# Patient Record
Sex: Female | Born: 1980 | Race: Black or African American | Hispanic: No | Marital: Single | Smoking: Never smoker
Health system: Southern US, Community
[De-identification: ages and names within clinical notes are randomized; demographics above are authoritative.]

## PROBLEM LIST (undated history)

## (undated) DIAGNOSIS — R030 Elevated blood-pressure reading, without diagnosis of hypertension: Secondary | ICD-10-CM

## (undated) HISTORY — DX: Elevated blood-pressure reading, without diagnosis of hypertension: R03.0

## (undated) HISTORY — PX: BREAST CYST ASPIRATION: SHX578

---

## 2015-01-25 ENCOUNTER — Other Ambulatory Visit: Payer: Self-pay | Admitting: Gynecology

## 2015-01-25 DIAGNOSIS — N6001 Solitary cyst of right breast: Secondary | ICD-10-CM

## 2015-02-05 ENCOUNTER — Ambulatory Visit
Admission: RE | Admit: 2015-02-05 | Discharge: 2015-02-05 | Disposition: A | Discharge status: Home / Self Care | Payer: BLUE CROSS/BLUE SHIELD | Source: Ambulatory Visit | Attending: Gynecology | Admitting: Gynecology

## 2015-02-05 ENCOUNTER — Other Ambulatory Visit: Payer: Self-pay | Admitting: Gynecology

## 2015-02-05 DIAGNOSIS — N6001 Solitary cyst of right breast: Secondary | ICD-10-CM

## 2015-02-05 DIAGNOSIS — N6022 Fibroadenosis of left breast: Secondary | ICD-10-CM

## 2015-03-01 ENCOUNTER — Ambulatory Visit
Admission: RE | Admit: 2015-03-01 | Discharge: 2015-03-01 | Disposition: A | Discharge status: Home / Self Care | Payer: BLUE CROSS/BLUE SHIELD | Source: Ambulatory Visit | Attending: Gynecology | Admitting: Gynecology

## 2015-03-01 DIAGNOSIS — N6001 Solitary cyst of right breast: Secondary | ICD-10-CM

## 2015-12-30 ENCOUNTER — Ambulatory Visit (INDEPENDENT_AMBULATORY_CARE_PROVIDER_SITE_OTHER): Payer: BLUE CROSS/BLUE SHIELD | Admitting: Family Medicine

## 2015-12-30 ENCOUNTER — Encounter: Payer: Self-pay | Admitting: Family Medicine

## 2015-12-30 VITALS — BP 146/88 | HR 98 | Temp 98.9°F | Ht 63.75 in | Wt 139.2 lb

## 2015-12-30 DIAGNOSIS — Z0001 Encounter for general adult medical examination with abnormal findings: Secondary | ICD-10-CM | POA: Diagnosis not present

## 2015-12-30 DIAGNOSIS — M940 Chondrocostal junction syndrome [Tietze]: Secondary | ICD-10-CM | POA: Diagnosis not present

## 2015-12-30 DIAGNOSIS — R030 Elevated blood-pressure reading, without diagnosis of hypertension: Secondary | ICD-10-CM

## 2015-12-30 DIAGNOSIS — Z114 Encounter for screening for human immunodeficiency virus [HIV]: Secondary | ICD-10-CM

## 2015-12-30 NOTE — Progress Notes (Signed)
Phone: [phone]  Subjective:  Patient presents today to establish care. Chief complaint-noted.   See problem oriented charting  The following were reviewed and entered/updated in epic: Past Medical History:  Diagnosis Date  . Elevated blood pressure reading    Patient Active Problem List   Diagnosis Date Noted  . Elevated blood pressure reading    Past Surgical History:  Procedure Laterality Date  . BREAST CYST ASPIRATION     left and right both drained- benign    Family History  Problem Relation Age of Onset  . Hypertension Mother     mom [id number] at time of start  . Other Father     unknown cause of deaht    Medications- reviewed and updated Current Outpatient Prescriptions  Medication Sig Dispense Refill  . ibuprofen (ADVIL,MOTRIN) 800 MG tablet Take 800 mg by mouth every 8 (eight) hours as needed.     No current facility-administered medications for this visit.     Allergies-reviewed and updated No Known Allergies  Social History   Social History  . Marital status: Unknown    Spouse name: N/A  . Number of children: N/A  . Years of education: N/A   Social History Main Topics  . Smoking status: Never Smoker  . Smokeless tobacco: Never Used  . Alcohol use Yes     Comment: Social  . Drug use: No  . Sexual activity: Yes    Birth control/ protection: None, Condom   Other Topics Concern  . None   Social History Narrative   Single lives with son 37 years old TogoJoshua in 12/2015.       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    Associates degree- [employer] [place]. States always wanted to live in [place]. Born in [place].       Hobbies: relaxing, writes poetry    ROS--Full ROS was completed Review of Systems  Constitutional: Negative for chills and fever.  HENT: Negative for hearing loss and tinnitus.   Eyes: Negative for blurred vision and double vision.  Respiratory: Positive for shortness of breath. Negative for cough, hemoptysis and wheezing.     Cardiovascular: Positive for chest pain. Negative for palpitations and leg swelling.  Gastrointestinal: Negative for heartburn and nausea.  Genitourinary: Negative for dysuria and urgency.  Musculoskeletal: Negative for myalgias and neck pain.  Skin: Negative for itching and rash.  Neurological: Negative for dizziness and headaches.  Endo/Heme/Allergies: Negative for polydipsia. Does not bruise/bleed easily.  Psychiatric/Behavioral: Negative for substance abuse and suicidal ideas.   Objective: BP (!) 146/88   Pulse 98   Temp 98.9 F (37.2 C) (Oral)   Ht 5' 3.75" (1.619 m)   Wt 139 lb 3.2 oz (63.1 kg)   LMP 12/20/2015   SpO2 98%   BMI 24.08 kg/m  Gen: NAD, resting comfortably HEENT: Mucous membranes are moist. Oropharynx normal. TM normal. Eyes: sclera and lids normal, PERRLA Neck: no thyromegaly, no cervical lymphadenopathy CV: RRR no murmurs rubs or gallops Chest wall: very tender with palpation of 2nd and 3rd rib where connects to sternum.  Lungs: CTAB no crackles, wheeze, rhonchi Abdomen: soft/nontender/nondistended/normal bowel sounds. No rebound or guarding.  Ext: no edema Skin: warm, dry Neuro: 5/5 strength in upper and lower extremities, normal gait, normal reflexes  EKG from urgent care: sinus bradycardia with rate 58, normal axis, normal intervals, no hypertrophy. T waves noted as changes in anterior leads but on inspection t wave in same direction as qrs. Largely normal EKG  Assessment/Plan:  35 y.o. female presenting for annual physical.  Health Maintenance counseling: 1. Anticipatory guidance: Patient counseled regarding regular dental exams (advised to update), eye exams (glasses at night), wearing seatbelts.  2. Risk factor reduction:  Advised patient of need for regular exercise and diet rich and fruits and vegetables to reduce risk of heart attack and stroke. Exercises 3x a week. Diet reasonable 3. Immunizations/screenings/ancillary studies-  Health  Maintenance Due  Topic Date Due  . HIV Screening - next labs 11/04/1993  . TETANUS/TDAP - wants to wait due to chest discomfort 11/04/1997  . INFLUENZA VACCINE - wants to wait due to chest discomfort 09/10/2015   4. Cervical cancer screening- pap smear last year 5. Breast cancer screening-  breast exam with gynecology and mammograms starting age 38- prior workup for benign cysts 6. Colon cancer screening - no family history, start at age 35  Chest pain- costochondritis Elevated blood pressure S: noticed chest pain last week. Last week when she laid down it felt like it was hard to breath- heaviness left side upperf chest. Did note mild shortness of breath coming up fromher mail box but otherwise no exertional symptoms if not using upper body (does have pain with twisting, lifting, pulling- which she has to do at work). SOB may be related to not being able to take full deep breath as she has pain in left upper chest. Has not worked out during this time. Mild soreness when she takes a deep breath in. Sore to touch. Pain can be up to 7/10 with deep breath. Mainly when sitting no pain. Sitting is best position- laying down hurts. Reassuring EKG at urgent care as well as x-ray. Took 800mg  ibuprofen - Friday, twice on Saturday, twice on Sunday for pleurisy. Some nasuea on the medicine. Does not really feel like she helps.   Has thought intermittently may have pulled a muscle at work in the past. No recent prolonged travel- no calf pain- no calf or leg swelling  A/P: I agree pleurisy on differential but I suspect more likely costochondritis given pinpoint reproducible pain. Seems that deep breaths cause her pain in this area so tris to breath more shallow and may explain perception of SOB at times. Either way- ibuprofen twice a day reasonable for this. We discussed likely time course- up to a month. Discussed unlikely ACS (age, no family history, left upper chest, no other symptoms except possible SOB at  times. Doubt DVT with no leg swelling and doubt PE with poinpoint pain.   Biggest concern is BP- she will return with home log and home BPs within a mont  2-4 week follow up. Fasting labs before then. Bp follow up mainly.   Orders Placed This Encounter  Procedures  . CBC    Standing Status:   Future    Standing Expiration Date:   12/29/2016  . Comprehensive metabolic panel    [place]    Standing Status:   Future    Standing Expiration Date:   12/29/2016  . HIV antibody    Standing Status:   Future    Standing Expiration Date:   12/29/2016  . Lipid panel    Standing Status:   Future    Standing Expiration Date:   12/29/2016  . TSH    Standing Status:   Future    Standing Expiration Date:   12/29/2016    Meds ordered this encounter  Medications  . ibuprofen (ADVIL,MOTRIN) 800 MG tablet    Sig: Take 800  mg by mouth every 8 (eight) hours as needed.    Return precautions advised.  Tana ConchStephen Genee Rann, MD

## 2015-12-30 NOTE — Progress Notes (Signed)
Pre visit review using our clinic review tool, if applicable. No additional management support is needed unless otherwise documented below in the visit note. 

## 2015-12-30 NOTE — Patient Instructions (Addendum)
Sign release of information at the check out desk for pap smear  Need tetanus shot and flu shot  Your blood pressure trend concerns me. I would like for you to buy/use a home cuff to check at least 5x a week. Your goal is <140/90 but preferably <130/80.  see me in 2-4 weeks. Bring your home cuff and your log of blood pressures with you to visit.   Schedule a lab visit at the check out desk within 2 weeks. Return for future fasting labs meaning nothing but water after midnight please. Ok to take your medications with water.   Costochondritis Costochondritis is swelling and irritation (inflammation) of the tissue (cartilage) that connects your ribs to your breastbone (sternum). This causes pain in the front of your chest. Usually, the pain:  Starts gradually.  Is in more than one rib. This condition usually goes away on its own over time usually within about a month.  Follow these instructions at home:  Do not do anything that makes your pain worse.  If directed, put ice on the painful area:  Put ice in a plastic bag.  Place a towel between your skin and the bag.  Leave the ice on for 20 minutes, 2-3 times a day.  If directed, put heat on the affected area as often as told by your doctor. Use the heat source that your doctor tells you to use, such as a moist heat pack or a heating pad.  Place a towel between your skin and the heat source.  Leave the heat on for 20-30 minutes.  Take off the heat if your skin turns bright red. This is very important if you cannot feel pain, heat, or cold. You may have a greater risk of getting burned.  Take over-the-counter and prescription medicines only as told by your doctor.  Return to your normal activities as told by your doctor. Ask your doctor what activities are safe for you.  Keep all follow-up visits as told by your doctor. This is important. Contact a doctor if:  You have chills or a fever.  Your pain does not go away or it gets  worse.  You have a cough that does not go away. Get help right away if:  You are short of breath. This information is not intended to replace advice given to you by your health care provider. Make sure you discuss any questions you have with your health care provider. Document Released: 07/15/2007 Document Revised: 08/16/2015 Document Reviewed: 05/22/2015 Elsevier Interactive Patient Education  2017 [employer]

## 2016-01-07 ENCOUNTER — Telehealth: Payer: Self-pay

## 2016-01-07 NOTE — Telephone Encounter (Signed)
Called and left a voicemail message for patient asking for a return phone call. We received FMLA paperwork for patient. Need to discuss getting it filled out and returned.

## 2016-01-08 ENCOUNTER — Other Ambulatory Visit (INDEPENDENT_AMBULATORY_CARE_PROVIDER_SITE_OTHER): Payer: BLUE CROSS/BLUE SHIELD

## 2016-01-08 DIAGNOSIS — Z0001 Encounter for general adult medical examination with abnormal findings: Secondary | ICD-10-CM | POA: Diagnosis not present

## 2016-01-08 DIAGNOSIS — Z114 Encounter for screening for human immunodeficiency virus [HIV]: Secondary | ICD-10-CM

## 2016-01-08 LAB — LIPID PANEL
CHOL/HDL RATIO: 2
Cholesterol: 148 mg/dL (ref 0–200)
HDL: 65.1 mg/dL (ref >39.00)
LDL CALC: 75 mg/dL (ref 0–99)
NonHDL: 82.93
TRIGLYCERIDES: 41 mg/dL (ref 0.0–149.0)
VLDL: 8.2 mg/dL (ref 0.0–40.0)

## 2016-01-08 LAB — COMPREHENSIVE METABOLIC PANEL
ALT: 13 U/L (ref 0–35)
AST: 16 U/L (ref 0–37)
Albumin: 4.4 g/dL (ref 3.5–5.2)
Alkaline Phosphatase: 44 U/L (ref 39–117)
BUN: 9 mg/dL (ref 6–23)
CHLORIDE: 101 meq/L (ref 96–112)
CO2: 27 meq/L (ref 19–32)
CREATININE: 0.79 mg/dL (ref 0.40–1.20)
Calcium: 9.4 mg/dL (ref 8.4–10.5)
GFR: 105.22 mL/min (ref >60.00)
Glucose, Bld: 83 mg/dL (ref 70–99)
POTASSIUM: 3.6 meq/L (ref 3.5–5.1)
SODIUM: 137 meq/L (ref 135–145)
Total Bilirubin: 0.7 mg/dL (ref 0.2–1.2)
Total Protein: 7.3 g/dL (ref 6.0–8.3)

## 2016-01-08 LAB — CBC
HCT: 40.8 % (ref 36.0–46.0)
Hemoglobin: 13.9 g/dL (ref 12.0–15.0)
MCHC: 34 g/dL (ref 30.0–36.0)
MCV: 90.6 fl (ref 78.0–100.0)
Platelets: 241 10*3/uL (ref 150.0–400.0)
RBC: 4.51 Mil/uL (ref 3.87–5.11)
RDW: 13 % (ref 11.5–15.5)
WBC: 6.9 10*3/uL (ref 4.0–10.5)

## 2016-01-08 LAB — TSH: TSH: 0.62 u[IU]/mL (ref 0.35–4.50)

## 2016-01-09 LAB — HIV ANTIBODY (ROUTINE TESTING W REFLEX): HIV 1&2 Ab, 4th Generation: NONREACTIVE

## 2016-01-13 ENCOUNTER — Ambulatory Visit (INDEPENDENT_AMBULATORY_CARE_PROVIDER_SITE_OTHER): Payer: BLUE CROSS/BLUE SHIELD | Admitting: Family Medicine

## 2016-01-13 ENCOUNTER — Encounter: Payer: Self-pay | Admitting: Family Medicine

## 2016-01-13 VITALS — BP 144/94 | HR 87 | Temp 98.4°F | Ht 63.75 in | Wt 138.0 lb

## 2016-01-13 DIAGNOSIS — M940 Chondrocostal junction syndrome [Tietze]: Secondary | ICD-10-CM

## 2016-01-13 DIAGNOSIS — R030 Elevated blood-pressure reading, without diagnosis of hypertension: Secondary | ICD-10-CM

## 2016-01-13 DIAGNOSIS — R0789 Other chest pain: Secondary | ICD-10-CM | POA: Insufficient documentation

## 2016-01-13 MED ORDER — CYCLOBENZAPRINE HCL 5 MG PO TABS: 5.0000 mg | ORAL_TABLET | Freq: Three times a day (TID) | ORAL | 1 refills | Status: DC | PRN

## 2016-01-13 NOTE — Assessment & Plan Note (Signed)
S: monitored at home and averaged 140/90.  BP Readings from Last 3 Encounters:  01/13/16 (!) 144/94  12/30/15 (!) 146/88  A/P: We discussed this couldbe hypertension (mother does have but just recently diagnosed) but also could be due to the pain from her chest- if BP up when pain improves would consider medication- she already exercises (outside of current chest pain) and eats reasonably well- weight reasonable so do not think weight loss/lifestyle will help significantly.

## 2016-01-13 NOTE — Patient Instructions (Addendum)
Schedule at check out for 9:15 on the 20th of December (ok to unblock spot)   Avoid any strenuous lifting- I would avoid anything over 5-10 lbs  Monitor blood pressure for now considering your pain level

## 2016-01-13 NOTE — Assessment & Plan Note (Signed)
S: Patient with continued left upper chest pain. Worse with palpation. No exertional symptoms. Pain 7/10 right now and most of the day- schedules ibuprofen- worse if she does not. Pain gets worse with pushing or pulling. 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 Took groceries in her house one day and next day had soreness on right side as well. No shortness of breath reported since last visit- and prior seemed to be related to pain with taking deep breath in her left chest. She does have worse discomfort when laying all the way down so props herself up at night. A/P: Will keep out of work another 2 weeks the reassess. Would be a month at that point and if no improvement in pain. Consider chest MRI if covered, CT if not. Flexeril has helped a patient with similar symptoms recently so will trial. Asks about biofreeze which is ok as well. If MRI normal, pain persists- consider cardiac workup. Once again given pinpoint pain strongly doubt cardiac or DVT/PE related- still no leg swelling or pain.

## 2016-01-13 NOTE — Progress Notes (Signed)
Pre visit review using our clinic review tool, if applicable. No additional management support is needed unless otherwise documented below in the visit note. 

## 2016-01-13 NOTE — Progress Notes (Signed)
Subjective:  Stacy Costa is a 35 y.o. year old very pleasant female patient who presents for/with See problem oriented charting ROS- No shortness of breath. No headache or blurry vision. No nausea, left arm or neck pain.   Past Medical History-  Patient Active Problem List   Diagnosis Date Noted  . Costochondritis 01/13/2016  . Elevated blood pressure reading     Medications- reviewed and updated Current Outpatient Prescriptions  Medication Sig Dispense Refill  . ibuprofen (ADVIL,MOTRIN) 800 MG tablet Take 800 mg by mouth every 8 (eight) hours as needed.    . cyclobenzaprine (FLEXERIL) 5 MG tablet Take 1 tablet (5 mg total) by mouth 3 (three) times daily as needed for muscle spasms. 30 tablet 1   No current facility-administered medications for this visit.     Objective: BP (!) 144/94   Pulse 87   Temp 98.4 F (36.9 C) (Oral)   Ht 5' 3.75" (1.619 m)   Wt 138 lb (62.6 kg)   LMP 12/20/2015   SpO2 97%   BMI 23.87 kg/m  Gen: NAD, resting comfortably CV: RRR no murmurs rubs or gallops Extremely tender to palpation on left chest wall worst at junction of 2nd and 3rd rib with sternum- some tenderness lower Lungs: CTAB no crackles, wheeze, rhonchi Abdomen: soft/nontender/nondistended/normal bowel sounds. No rebound or guarding.  Ext: no edema Skin: warm, dry, no rash  Assessment/Plan:  Costochondritis S: Patient with continued left upper chest pain. Worse with palpation. No exertional symptoms. Pain 7/10 right now and most of the day- schedules ibuprofen- worse if she does not. Pain gets worse with pushing or pulling. 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 Took groceries in her house one day and next day had soreness on right side as well. No shortness of breath reported since last visit- and prior seemed to be related to pain with taking deep breath in her left chest. She does have worse discomfort when laying all the way down so props  herself up at night. A/P: Will keep out of work another 2 weeks the reassess. Would be a month at that point and if no improvement in pain. Consider chest MRI if covered, CT if not. Flexeril has helped a patient with similar symptoms recently so will trial. Asks about biofreeze which is ok as well. If MRI normal, pain persists- consider cardiac workup. Once again given pinpoint pain strongly doubt cardiac or DVT/PE related- still no leg swelling or pain.   Elevated blood pressure reading S: monitored at home and averaged 140/90.  BP Readings from Last 3 Encounters:  01/13/16 (!) 144/94  12/30/15 (!) 146/88  A/P: We discussed this couldbe hypertension (mother does have but just recently diagnosed) but also could be due to the pain from her chest- if BP up when pain improves would consider medication- she already exercises (outside of current chest pain) and eats reasonably well- weight reasonable so do not think weight loss/lifestyle will help significantly.   2 weeks- FMLA out through 20th of december  Meds ordered this encounter  Medications  . cyclobenzaprine (FLEXERIL) 5 MG tablet    Sig: Take 1 tablet (5 mg total) by mouth 3 (three) times daily as needed for muscle spasms.    Dispense:  30 tablet    Refill:  1    Return precautions advised.  Tana ConchStephen Keyasia Jolliff, MD

## 2016-01-29 ENCOUNTER — Other Ambulatory Visit: Payer: Self-pay

## 2016-01-29 ENCOUNTER — Ambulatory Visit (INDEPENDENT_AMBULATORY_CARE_PROVIDER_SITE_OTHER): Payer: BLUE CROSS/BLUE SHIELD | Admitting: Family Medicine

## 2016-01-29 ENCOUNTER — Encounter: Payer: Self-pay | Admitting: Family Medicine

## 2016-01-29 DIAGNOSIS — M940 Chondrocostal junction syndrome [Tietze]: Secondary | ICD-10-CM | POA: Diagnosis not present

## 2016-01-29 DIAGNOSIS — R079 Chest pain, unspecified: Secondary | ICD-10-CM

## 2016-01-29 DIAGNOSIS — R030 Elevated blood-pressure reading, without diagnosis of hypertension: Secondary | ICD-10-CM | POA: Diagnosis not present

## 2016-01-29 MED ORDER — IBUPROFEN 800 MG PO TABS: 800.0000 mg | ORAL_TABLET | Freq: Three times a day (TID) | ORAL | 0 refills | Status: DC | PRN

## 2016-01-29 MED ORDER — CYCLOBENZAPRINE HCL 5 MG PO TABS: 5.0000 mg | ORAL_TABLET | Freq: Three times a day (TID) | ORAL | 1 refills | Status: DC | PRN

## 2016-01-29 MED ORDER — AMLODIPINE BESYLATE 2.5 MG PO TABS: 2.5000 mg | ORAL_TABLET | Freq: Every day | ORAL | 3 refills | Status: DC

## 2016-01-29 NOTE — Progress Notes (Signed)
Pre visit review using our clinic review tool, if applicable. No additional management support is needed unless otherwise documented below in the visit note. 

## 2016-01-29 NOTE — Patient Instructions (Signed)
Start low dose amlodipine 2.5mg  each morning  Would like to try the ibuprofen even just 400mg  OTC 2-3x a day to see if we can reduce overall burden on your kidneys and effect on blood pressure.   Refilled 800 ibuprofen and flexeril  We will call you within a week about your referral to sports medicine. If you do not hear within 2 weeks, give us a call.   Also entered MRI today- call us if have not heard by early January  Fill out paperwork for FMLA based on when we get sports medicine and MRI done  Follow up 1 month

## 2016-01-29 NOTE — Assessment & Plan Note (Signed)
S: controlled poorly on no medicatoin. Mainly 140s at home, rarely high 130s.  BP Readings from Last 3 Encounters:  01/29/16 (!) 164/84  01/13/16 (!) 144/94  12/30/15 (!) 146/88  A/P: opted for low dose amlodipine 2.5mg , wonder if will be able to come off after pain in chest better

## 2016-01-29 NOTE — Assessment & Plan Note (Signed)
S: Pain at baseline down from 7/10 to 6/10. With flexeril down to 4/10. Ibuprofen twice a day also helping. Biofreeze also helps. Severe enough to keep her out of work- Worsens significantly after day of activity but not at time time of activity (no exertional symptoms and no shortness of breath). Biofreeze helps. She also has had over a month of symptoms now and this would normally be when we would expect improvement A/P: We had agreed to pursue chest MR if no significant improvement and will pursue at this time. 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

## 2016-01-29 NOTE — Progress Notes (Signed)
Subjective:  Stacy Costa is a 35 y.o. year old very pleasant female patient who presents for/with See problem oriented charting ROS- no shortness of breath, exertional chest pain, fever, chills, cough   Past Medical History-  Patient Active Problem List   Diagnosis Date Noted  . Costochondritis 01/13/2016  . Elevated blood pressure reading     Medications- reviewed and updated Current Outpatient Prescriptions  Medication Sig Dispense Refill  . cyclobenzaprine (FLEXERIL) 5 MG tablet Take 1 tablet (5 mg total) by mouth 3 (three) times daily as needed for muscle spasms. 30 tablet 1  . ibuprofen (ADVIL,MOTRIN) 800 MG tablet Take 1 tablet (800 mg total) by mouth every 8 (eight) hours as needed. 30 tablet 0   No current facility-administered medications for this visit.     Objective: BP (!) 164/84 (BP Location: Left Arm, Patient Position: Sitting, Cuff Size: Large)   Pulse 88   Temp 98.3 F (36.8 C) (Oral)   Ht 5' 3.75" (1.619 m)   Wt 139 lb 12.8 oz (63.4 kg)   LMP 01/20/2016   SpO2 98%   BMI 24.19 kg/m  Gen: NAD, resting comfortably CV: RRR no murmurs rubs or gallops Very tender with palpation at junction of sternum and ribs 2-4 Lungs: CTAB no crackles, wheeze, rhonchi Abdomen: soft/nontender/nondistended/normal bowel sounds. No rebound or guarding.  Ext: no edema Skin: warm, dry, no rash over chest  Assessment/Plan:  Elevated blood pressure reading S: controlled poorly on no medicatoin. Mainly 140s at home, rarely high 130s.  BP Readings from Last 3 Encounters:  01/29/16 (!) 164/84  01/13/16 (!) 144/94  12/30/15 (!) 146/88  A/P: opted for low dose amlodipine 2.5mg , wonder if will be able to come off after pain in chest better  Costochondritis S: Pain at baseline down from 7/10 to 6/10. With flexeril down to 4/10. Ibuprofen twice a day also helping. Biofreeze also helps. Severe enough to keep her out of work- Worsens significantly after day of activity but not at  time time of activity (no exertional symptoms and no shortness of breath). Biofreeze helps. She also has had over a month of symptoms now and this would normally be when we would expect improvement A/P: We had agreed to pursue chest MR if no significant improvement and will pursue at this time. 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    1 month  Order stated MR sternum Orders Placed This Encounter  Procedures  . MR CHEST MEDIASTINUM LIMITED    Standing Status:   Future    Standing Expiration Date:   03/31/2017    Order Specific Question:   Reason for Exam (SYMPTOM  OR DIAGNOSIS REQUIRED)    Answer:   chest wall pain over a month 6/10. keeping her out of work. suspect costochondiritis but scanning given lack of improvement    Order Specific Question:   Preferred imaging location?    Answer:   [facility] (table limit-550lbs)    Order Specific Question:   What is the patient's sedation requirement?    Answer:   No Sedation    Order Specific Question:   Does the patient have a pacemaker or implanted devices?    Answer:   No  . Ambulatory referral to Sports Medicine    Referral Priority:   Routine    Referral Type:  Consultation    Number of Visits Requested:   1    Meds ordered this encounter  Medications  . amLODipine (NORVASC) 2.5 MG tablet    Sig: Take 1 tablet (2.5 mg total) by mouth daily.    Dispense:  90 tablet    Refill:  3  . cyclobenzaprine (FLEXERIL) 5 MG tablet    Sig: Take 1 tablet (5 mg total) by mouth 3 (three) times daily as needed for muscle spasms.    Dispense:  30 tablet    Refill:  1  . ibuprofen (ADVIL,MOTRIN) 800 MG tablet    Sig: Take 1 tablet (800 mg total) by mouth every 8 (eight) hours as needed.    Dispense:  30 tablet    Refill:  0    Return precautions advised.  Tana ConchStephen Tilla Wilborn, MD

## 2016-02-28 ENCOUNTER — Encounter: Payer: Self-pay | Admitting: Family Medicine

## 2016-02-28 ENCOUNTER — Ambulatory Visit (INDEPENDENT_AMBULATORY_CARE_PROVIDER_SITE_OTHER): Payer: BLUE CROSS/BLUE SHIELD | Admitting: Family Medicine

## 2016-02-28 VITALS — BP 138/88 | Ht 63.0 in | Wt 140.0 lb

## 2016-02-28 DIAGNOSIS — R0789 Other chest pain: Secondary | ICD-10-CM | POA: Diagnosis not present

## 2016-02-28 DIAGNOSIS — R0609 Other forms of dyspnea: Secondary | ICD-10-CM

## 2016-02-28 NOTE — Progress Notes (Signed)
  Stacy DroneShanetta Inniss - 36 y.o. female MRN [id number]  Date of birth: [birth year]    SUBJECTIVE:      Chief Complaint:/ HPI:   LEFT sided chest pain since week before H Thanksgiving. Remembers no specific instance of injury. Works as Pharmacist, [employer]. Was going to gym but has stopped secondary to pain. Had not changed her workout routine in months before her pain started. For first few weeks o chest pain she as\lso noted more SOB with climbing stairs but that has mostly resolved. No cough. For a brief time in December she had similar but much less severe pain on right side of chest. That resolved after about a week to 10 days.  Left chest pain is always present, somewhat worse with certain motions but present at rest. Possibly was worse initially with deep breath but not currently. No cough.   Has been out of work last few days/weeks because of the pain. Pain is aching and or pressure like. It may be a little worse with laying down.  Recently had several benign breast cysts aspirated---first tome she was diagnosed wt these. Aspiration did not change her chest pain.   ROS:     No fever, no unusual weight change. No rashes. No other unusual arthralgias or myalgias.  PERTINENT  PMH / PSH FH / / SH:  Past Medical, Surgical, Social, and Family History Reviewed & Updated in the EMR.  Pertinent findings include:  Breast cyst aspiration  Hx elevated BP  [facility] and by her report both were normal  OBJECTIVE: BP 138/88   Ht 5\' 3"  (1.6 m)   Wt 140 lb (63.5 kg)   LMP 01/20/2016   BMI 24.80 kg/m   Physical Exam:  Vital signs are reviewed. WD WN NAD CV RRR  LUNGS normal respiratory rate, no increased WOB CHEST WALL: ttp mid pectoral muscle and this reproduces her pain. Pec muscle is without notable defect.  US [place] and AC joint appear normal. Pec muscles are without any sign of defect.   ASSESSMENT & PLAN:  See problem based charting & AVS for pt instructions.

## 2016-02-28 NOTE — Assessment & Plan Note (Signed)
Most likely this is a pectoralis minor muscle strain although absolutely no hx of trauma or injury. Symptoms now haaave been present for almost 2 months which is not consistent wit  h that diagnosis. Her symptoms of SOB at initiation of chest pain concerns me for pulmonary embolus. Initially I wanted to see a CTA of chest---that would rule out PE, five me a look at her muscle tissue (some--not as good as MRI but some) and evaluate for anything terribly unusual like vascular aneurysm etc. Her insurance will not approve that so we will start with D Dimer. If positive, will proceed with CTA. If negative, will proceed with further eval./treatment for unusual chest pain possible pec minor tear.

## 2016-03-02 ENCOUNTER — Other Ambulatory Visit: Payer: BLUE CROSS/BLUE SHIELD

## 2016-03-02 LAB — D-DIMER, QUANTITATIVE: D-Dimer, Quant: <0.27 ug/mL-FEU (ref 0.00–0.50)

## 2016-03-03 ENCOUNTER — Telehealth: Payer: Self-pay | Admitting: *Deleted

## 2016-03-03 NOTE — Telephone Encounter (Signed)
-----   Message from Lizbeth BarkMelanie L Ceresi sent at 03/03/2016 11:32 AM EST ----- Regarding: lab results Contact: [phone] Pt had lab yesterday wanting to know if someone will call her with results

## 2016-03-03 NOTE — Telephone Encounter (Signed)
Can you call her with the results and see if we need to do the CT angiogram or not?  Or let me know what I can tell her. Thanks!

## 2016-03-04 NOTE — Telephone Encounter (Signed)
[name] Emailed you some [employer] exercises. If you can get them off her email let me know. He can mail them to her or she can pick them up. Regarding her work restrictions, I think as long as she didn't pick up anything greater than 30 pounds she would be fine. If she's not carpal with that and needs more restrictions that I am fine with that but I don't think she's going to injure herself. If you could send her letter. Picture she does follow-up with me because this is something weird. THANKS! Stacy LevySara Jenina Moening

## 2016-03-04 NOTE — Telephone Encounter (Signed)
Stacy Costa Please call her and tell her the test was NORMAL so likelihood of her havinga clot in her lung is very very very low.Since insurance will not approve CT scan,I think we move forward with rehab for her assuming this is a pec muscle tear. Let;'s have her come by and pick up a theraband and we can instruct her on wallpush ups, pec muscle rehab. I will try to come up w a handout---I am ion clinic this AM bit can maybe do one up for her at lunch--OR you can just show her pec exefcises. I want her doing them BID. Then I would see her back in3 -4 weeks. If no better then, we will retry the imaging. If she has questions, I will be haooy too call her this afternoon Let me know THANKS! Stacy LevySara Marshal Costa

## 2016-03-04 NOTE — Telephone Encounter (Signed)
I spoke with the patient and she has therabands at home so I said we can send her exercises thru mychart.  I'll look for some pec exercises.   She also works as a Pharmacist, [employer] and is currently on restrictions from her PCP, so she wants to see what work restrictions you have for her now so she can let her employer know. Thanks!

## 2016-03-10 DIAGNOSIS — L218 Other seborrheic dermatitis: Secondary | ICD-10-CM | POA: Diagnosis not present

## 2016-03-10 DIAGNOSIS — L700 Acne vulgaris: Secondary | ICD-10-CM | POA: Diagnosis not present

## 2016-03-24 ENCOUNTER — Telehealth: Payer: Self-pay | Admitting: *Deleted

## 2016-03-24 NOTE — Telephone Encounter (Signed)
Patient called still having some pain in her left chest/rib area.  Isn't able to do pushups and only limited exercises with a theraband.  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

## 2016-03-26 NOTE — Telephone Encounter (Signed)
I will certainly try to order it--her insurance blocked it kast time despite my best efforts S

## 2016-03-27 ENCOUNTER — Other Ambulatory Visit: Payer: Self-pay | Admitting: *Deleted

## 2016-03-27 ENCOUNTER — Ambulatory Visit (INDEPENDENT_AMBULATORY_CARE_PROVIDER_SITE_OTHER): Payer: BLUE CROSS/BLUE SHIELD | Admitting: Family Medicine

## 2016-03-27 ENCOUNTER — Encounter: Payer: Self-pay | Admitting: Family Medicine

## 2016-03-27 VITALS — BP 134/89 | HR 71 | Ht 63.0 in | Wt 140.0 lb

## 2016-03-27 DIAGNOSIS — K8020 Calculus of gallbladder without cholecystitis without obstruction: Secondary | ICD-10-CM

## 2016-03-27 DIAGNOSIS — R071 Chest pain on breathing: Secondary | ICD-10-CM

## 2016-03-27 DIAGNOSIS — R0789 Other chest pain: Secondary | ICD-10-CM

## 2016-03-28 NOTE — Progress Notes (Signed)
  Stacy Costa - 36 y.o. female MRN [id number]  Date of birth: 1980/10/19    SUBJECTIVE:      Chief Complaint:/ HPI:   continued left sided chest pain but more localized now Can reproduce by certain movements or positions. Radiates along left lower rib. Starts over xyphoid area. Sometimes radiates to back and occasionally has separate upper back pain that coincides but does not seem to be radiating from xyphoid area.No SOb. Occasinally pain has been severe---10/10--these episodes frighten her and worry her for something cardiac or lung related. No nausea associated ever and no sweats. Was not able to do pushup exercises or overhead press as seemed to exacerbate.   ROS:     See HPI    OBJECTIVE: BP 134/89   Pulse 71   Ht 5\' 3"  (1.6 m)   Wt 140 lb (63.5 kg)   BMI 24.80 kg/m   Physical Exam:  Vital signs are reviewed. CHEST TTP xyphoid and left lower rib margin. Xyphoid is somewhat nodular o palpation (bifid?) No TTP in mid pectoralis area BACK:  without deformity. Nontender. No CVA tenderness to percussion. EXTREMITY: FROM bilateral shoulders and strength 5/5 all planes of rotator cuff. No motion reproduces pain. TRUNK: hyperextension and rotation of trunk relative to hips reproduces pain SKIN no lesions or rash in area of chest / ribs PSYCH AXOX4. Normal affect.  ASSESSMENT & PLAN:  Odd---had CXR outside provider which was reportedly negative. We were unable to get CT chest after last visit secondary to insurance refusal. Did get d dimer which was negative. Costochondritis is most likely diagnosis but xyphoid is somewhat nodular on palpation and her clinical course is not unusually prolonged. Will try again to get Ct chest to rule out bony pathology / tumor, lung pathology and also evaluate for cause of a chronic costochondral inflammation source.. Pain seems to be more localized to bony skeleton at this visit rather than mid pectoral as on last visit.

## 2016-03-30 ENCOUNTER — Ambulatory Visit
Admission: RE | Admit: 2016-03-30 | Discharge: 2016-03-30 | Disposition: A | Discharge status: Home / Self Care | Payer: BLUE CROSS/BLUE SHIELD | Source: Ambulatory Visit | Attending: Family Medicine | Admitting: Family Medicine

## 2016-03-30 DIAGNOSIS — R072 Precordial pain: Secondary | ICD-10-CM | POA: Diagnosis not present

## 2016-03-30 DIAGNOSIS — R0789 Other chest pain: Secondary | ICD-10-CM

## 2016-03-30 MED ORDER — IOPAMIDOL (ISOVUE-300) INJECTION 61%
75.0000 mL | Freq: Once | INTRAVENOUS | Status: AC | PRN
  Administered 2016-03-30: 75 mL via INTRAVENOUS

## 2016-04-01 ENCOUNTER — Telehealth: Payer: Self-pay | Admitting: *Deleted

## 2016-04-01 NOTE — Telephone Encounter (Signed)
Can you call her about the CT results? Thanks!

## 2016-04-01 NOTE — Telephone Encounter (Signed)
Spoke with her about CT results. The rib sternum and xiphoid all look normal. Interestingly, they commented on a large number of gallstones potentially in her gallbladder. I wonder if this is what's causing her issues. He would be unusual but not unheard of for the gallbladder to radiate pain.  Stacy Costa Please set her up for gallbladder ultrasound THANKS! Stacy LevySara Shanique Costa

## 2016-04-02 DIAGNOSIS — K8020 Calculus of gallbladder without cholecystitis without obstruction: Secondary | ICD-10-CM | POA: Insufficient documentation

## 2016-04-07 ENCOUNTER — Ambulatory Visit
Admission: RE | Admit: 2016-04-07 | Discharge: 2016-04-07 | Disposition: A | Discharge status: Home / Self Care | Payer: BLUE CROSS/BLUE SHIELD | Source: Ambulatory Visit | Attending: Family Medicine | Admitting: Family Medicine

## 2016-04-07 ENCOUNTER — Telehealth: Payer: Self-pay | Admitting: Family Medicine

## 2016-04-07 ENCOUNTER — Ambulatory Visit (INDEPENDENT_AMBULATORY_CARE_PROVIDER_SITE_OTHER): Payer: BLUE CROSS/BLUE SHIELD | Admitting: Family Medicine

## 2016-04-07 ENCOUNTER — Encounter: Payer: Self-pay | Admitting: Family Medicine

## 2016-04-07 VITALS — BP 124/94 | HR 92 | Temp 98.6°F | Ht 63.0 in | Wt 140.6 lb

## 2016-04-07 DIAGNOSIS — R0789 Other chest pain: Secondary | ICD-10-CM | POA: Diagnosis not present

## 2016-04-07 DIAGNOSIS — K8020 Calculus of gallbladder without cholecystitis without obstruction: Secondary | ICD-10-CM

## 2016-04-07 DIAGNOSIS — R030 Elevated blood-pressure reading, without diagnosis of hypertension: Secondary | ICD-10-CM | POA: Diagnosis not present

## 2016-04-07 NOTE — Progress Notes (Signed)
Pre visit review using our clinic review tool, if applicable. No additional management support is needed unless otherwise documented below in the visit note. 

## 2016-04-07 NOTE — Patient Instructions (Signed)
Keep monitoring blood pressure at home. Goal at least less than <135/90- glad you have been in that range or much lower without medicine. Remain off.   Bring cuff to next visit as tend to run higher here- may just be white coat element  Let us know if you want surgical or GI referral

## 2016-04-07 NOTE — Assessment & Plan Note (Signed)
S: controlled at home on no medication. She states her home #s have been 110s/70s to high 80s. Both #s at sports medicine visits were controlled as well- including last visit when not on the amlodipine 2.5mg . She had been feeling lightheaded when on the medicine but feeling better now BP Readings from Last 3 Encounters:  04/07/16 (!) 124/94  03/27/16 134/89  02/28/16 138/88  A/P:Continue without medicine- BP seems to have come down since chest pain has improved and is well controlled at home. Mild poor control in office today - will monitor at future visits

## 2016-04-07 NOTE — Assessment & Plan Note (Addendum)
See gallstone comments. Still suspect issues was costochondritis though was prolonged ( 3 months now resolved.   She states-  No shortness of breath. Has done some cardio and jumping rope and no issues, push ups without recurrence of pain which is reassuring.

## 2016-04-07 NOTE — Telephone Encounter (Signed)
Pt would like to proceed with the referral discussed for GI

## 2016-04-07 NOTE — Progress Notes (Signed)
Subjective:  Stacy Costa is a 36 y.o. year old very pleasant female patient who presents for/with See problem oriented charting ROS- No chest pain or shortness of breath. No headache or blurry vision. Feels best she has felt in months.    Past Medical History-  Patient Active Problem List   Diagnosis Date Noted  . Multiple gallstones 04/02/2016    Priority: Medium  . Elevated blood pressure reading     Priority: Medium  . Costochondral pain 03/27/2016  . 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 Other chest pain 01/13/2016    Medications- reviewed and updated Current Outpatient Prescriptions  Medication Sig Dispense Refill  . clindamycin (CLINDAGEL) 1 % gel      No current facility-administered medications for this visit.     Objective: BP (!) 124/94 (BP Location: Left Arm, Patient Position: Sitting, Cuff Size: Large)   Pulse 92   Temp 98.6 F (37 C) (Oral)   Ht 5\' 3"  (1.6 m)   Wt 140 lb 9.6 oz (63.8 kg)   SpO2 98%   BMI 24.91 kg/m  Gen: NAD, resting comfortably No pain with palpation of left upper chest to left of sternum.  CV: RRR no murmurs rubs or gallops Lungs: CTAB no crackles, wheeze, rhonchi Abdomen: soft/nontender even in RUQ with deep palpation/nondistended/normal bowel sounds. No rebound or guarding.  Ext: no edema  Assessment/Plan:  Elevated blood pressure reading S: controlled at home on no medication. She states her home #s have been 110s/70s to high 80s. Both #s at sports medicine visits were controlled as well- including last visit when not on the amlodipine 2.5mg . She had been feeling lightheaded when on the medicine but feeling better now BP Readings from Last 3 Encounters:  04/07/16 (!) 124/94  03/27/16 134/89  02/28/16 138/88  A/P:Continue without medicine- BP seems to have come down since chest pain has improved and is well controlled at home. Mild poor control in office today - will monitor at future visits  Multiple gallstones S: Patient had been  having significant upper chest pain to left of sternum worse with palpation and movement. She had a CT angio of chest to rule out more significant pathology and while chest was clear noted to have gallstones. Follow up ultrasound showed "Cholelithiasis. Largest stone 24 mm. Mild gallbladder wall thickening 3.2 mm" She stated around the time she had the CT scan her upper chest pain finally resolved (3 months into symptoms) A/P: It is certainly possible that her upper chest pain was referred pain from her gallstones. That being said I do not strongly suspect- I still think this was likely costochondritis. We discussed surgical referral given prior intensity of pain and possible correlation- she is firmly against surgery at this time. She would like to consider dissolution therapy. I told her I did not think this was ideal given one stone size 2.4 cm. She would prefer to discuss this option with GI and I have referred at this time.   Other chest pain See gallstone comments. Still suspect issues was costochondritis though was prolonged ( 3 months now resolved.   She states-  No shortness of breath. Has done some cardio and jumping rope and no issues, push ups without recurrence of pain which is reassuring.   Orders Placed This Encounter  Procedures  . Ambulatory referral to Gastroenterology    Referral Priority:   Routine    Referral Type:   Consultation    Referral Reason:   Specialty Services Required    Number  of Visits Requested:   1   Return precautions advised.  Tana Conch, MD

## 2016-04-07 NOTE — Assessment & Plan Note (Signed)
S: Patient had been having significant upper chest pain to left of sternum worse with palpation and movement. She had a CT angio of chest to rule out more significant pathology and while chest was clear noted to have gallstones. Follow up ultrasound showed "Cholelithiasis. Largest stone 24 mm. Mild gallbladder wall thickening 3.2 mm" She stated around the time she had the CT scan her upper chest pain finally resolved (3 months into symptoms) A/P: It is certainly possible that her upper chest pain was referred pain from her gallstones. That being said I do not strongly suspect- I still think this was likely costochondritis. We discussed surgical referral given prior intensity of pain and possible correlation- she is firmly against surgery at this time. She would like to consider dissolution therapy. I told her I did not think this was ideal given one stone size 2.4 cm. She would prefer to discuss this option with GI and I have referred at this time.

## 2016-04-07 NOTE — Telephone Encounter (Signed)
[name] Please let her know the US showed a HUGE gallstone (24 mm which is pretty big). I suspect this IS what is causing her pain. She needs to be seen by a surgeon for evaluation.Dr Tana ConchStephen Hunter (previously a resident here) is her PCP. Please call and tell her result. I suspect she should call HIS office for referral. I will send him a note in EPIC (if I can) or call him if I cannot send in EPIC.   IF she has problems getting her PCP to set that up, have her call me back. It would just work better insurance wise if her PCP does it as it is not typical Sm problem  THANKS! Denny LevySara Johnson [place]

## 2016-04-07 NOTE — Telephone Encounter (Signed)
Entered under visit from today

## 2016-04-08 ENCOUNTER — Encounter: Payer: Self-pay | Admitting: Gastroenterology

## 2016-04-08 NOTE — Telephone Encounter (Signed)
She saw Dr. Durene CalHunter yesterday and will be in touch with a Gastroenterologist for this matter. She said thanks for all your help!

## 2016-04-16 ENCOUNTER — Ambulatory Visit (INDEPENDENT_AMBULATORY_CARE_PROVIDER_SITE_OTHER): Payer: BLUE CROSS/BLUE SHIELD | Admitting: Gastroenterology

## 2016-04-16 ENCOUNTER — Encounter: Payer: Self-pay | Admitting: Gastroenterology

## 2016-04-16 VITALS — BP 142/80 | HR 80 | Ht 63.0 in | Wt 139.8 lb

## 2016-04-16 DIAGNOSIS — K219 Gastro-esophageal reflux disease without esophagitis: Secondary | ICD-10-CM | POA: Diagnosis not present

## 2016-04-16 DIAGNOSIS — K8020 Calculus of gallbladder without cholecystitis without obstruction: Secondary | ICD-10-CM | POA: Diagnosis not present

## 2016-04-16 NOTE — Addendum Note (Signed)
Addended by: Sharlee BlewBROWN, DESIREE P on: 04/16/2016 03:37 PM   Modules accepted: Orders

## 2016-04-16 NOTE — Patient Instructions (Signed)
You have been scheduled for an appointment with  at [facility]. They will call you with an appointment. Make certain to bring a list of current medications, including any over the counter medications or vitamins. Also bring your co-pay if you have one as well as your insurance cards. [facility] is located at [address], [address]. Should you need to reschedule your appointment, please contact them at [phone].  Consider a trial of daily OTC pepcid or Zantac.

## 2016-04-16 NOTE — Progress Notes (Signed)
Agree with surgical opinion for chest pain possibly due to symptomatic cholelithiasis

## 2016-04-16 NOTE — Progress Notes (Signed)
     04/16/2016 Stacy DroneShanetta Polkowski [id number] 1981-01-31   HISTORY OF PRESENT ILLNESS:  This is a pleasant 36 year old female who was referred here by her PCP, Dr. Durene CalHunter, for evaluation regarding recent chest pain, reflux, gallstones. The patient states that in November she developed some chest pain that was quite constant in nature. She had some evaluation including a CT of her chest. CT of the chest picked up several gallstones so then an ultrasound was performed and showed gallstones with the largest stone measuring 24 mm in the gallbladder neck with possible additional adherent stones or polyps along with slight gallbladder wall thickening of 3.2 mm.  In November labs were normal including CBC, CMP, TSH, d-dimer. She reports that her chest pain has actually resolved and she has not had any further symptoms in the past 3 weeks. She denied any associated abdominal pain, nausea, vomiting, fevers. She does admit to some reflux sensation. She says that she's had this intermittently for about 2 years and hasn't never taken any medication regularly for it. She says that she takes ginger or drinks ginger tea. She describes it as feeling like her "food isn't digesting". She denies dysphagia. She does not take any medication on a regular basis and prefers a more holistic approach.    Past Medical History:  Diagnosis Date  . Elevated blood pressure reading    Past Surgical History:  Procedure Laterality Date  . BREAST CYST ASPIRATION     left and right both drained- benign    reports that she has never smoked. She has never used smokeless tobacco. She reports that she drinks alcohol. She reports that she does not use drugs. family history includes Hypertension in her mother; Other in her father. No Known Allergies    Outpatient Encounter Prescriptions as of 04/16/2016  Medication Sig  . clindamycin (CLINDAGEL) 1 % gel    No facility-administered encounter medications on file as of 04/16/2016.       REVIEW OF SYSTEMS  : All other systems reviewed and negative except where noted in the History of Present Illness.   PHYSICAL EXAM: BP (!) 142/80   Pulse 80   Ht 5\' 3"  (1.6 m)   Wt 139 lb 12.8 oz (63.4 kg)   BMI 24.76 kg/m  General: Well developed black female in no acute distress Head: Normocephalic and atraumatic Eyes:  Sclerae anicteric, conjunctiva pink. Ears: Normal auditory acuity Lungs: Clear throughout to auscultation Heart: Regular rate and rhythm Abdomen: Soft, non-distended.  Normal bowel sounds.  Non-tender. Musculoskeletal: Symmetrical with no gross deformities  Skin: No lesions on visible extremities Extremities: No edema  Neurological: Alert oriented x 4, grossly non-focal Psychological:  Alert and cooperative. Normal mood and affect  ASSESSMENT AND PLAN: -Gallstones:  Large gallstone of 24 mm along with other smaller stones.  Will send to [facility] just to discuss regarding if cholecystectomy is recommended (I believe it is recommended for cholecystectomy for gallstones of 30 mm or more). -Chest pain:  Now resolved.  ? Reflux related vs MSK vs related to gallstones.   -GERD:  Intermittent.  Not really interested in taking medication daily.  Did that if her chest pain returned and she at least try OTC Zantac or Pepcid on a daily basis for a few weeks.    CC:  Shelva MajesticHunter, Stephen O, MD

## 2016-06-03 ENCOUNTER — Ambulatory Visit (INDEPENDENT_AMBULATORY_CARE_PROVIDER_SITE_OTHER): Payer: BLUE CROSS/BLUE SHIELD | Admitting: Family Medicine

## 2016-06-03 ENCOUNTER — Encounter: Payer: Self-pay | Admitting: Family Medicine

## 2016-06-03 VITALS — BP 138/94 | HR 101 | Temp 98.7°F | Ht 63.0 in | Wt 131.8 lb

## 2016-06-03 DIAGNOSIS — B9789 Other viral agents as the cause of diseases classified elsewhere: Secondary | ICD-10-CM | POA: Diagnosis not present

## 2016-06-03 DIAGNOSIS — R51 Headache: Secondary | ICD-10-CM

## 2016-06-03 DIAGNOSIS — R519 Headache, unspecified: Secondary | ICD-10-CM

## 2016-06-03 DIAGNOSIS — J069 Acute upper respiratory infection, unspecified: Secondary | ICD-10-CM | POA: Diagnosis not present

## 2016-06-03 DIAGNOSIS — J029 Acute pharyngitis, unspecified: Secondary | ICD-10-CM

## 2016-06-03 DIAGNOSIS — R030 Elevated blood-pressure reading, without diagnosis of hypertension: Secondary | ICD-10-CM

## 2016-06-03 LAB — POC INFLUENZA A&B (BINAX/QUICKVUE)
Influenza A, POC: NEGATIVE
Influenza B, POC: NEGATIVE

## 2016-06-03 LAB — POCT RAPID STREP A (OFFICE): RAPID STREP A SCREEN: NEGATIVE

## 2016-06-03 NOTE — Progress Notes (Addendum)
PCP: Tana Conch, MD  Subjective:  Stacy Costa is a 36 y.o. year old very pleasant female patient who presents with Upper Respiratory infection symptoms including nasal congestion, sore throat, cough, mild headaches, fatigue, mild nausea. Very mild sinus pressure. Some chills and warmth.  -started: 1 day ago, symptoms are worsening -previous treatments: none -sick contacts/travel/risks: denies flu exposure. Son was sick- coughing. potentialExposure with work.   ROS-denies fever, SOB, vomiting, diarrhea tooth pain  Pertinent Past Medical History-  Patient Active Problem List   Diagnosis Date Noted  . Gallstones 04/02/2016    Priority: Medium  . Elevated blood pressure reading     Priority: Medium  . Gastroesophageal reflux disease 04/16/2016  . Costochondral pain 03/27/2016  . [place] Hard 03/27/2016  . Other chest pain 01/13/2016   Medications- reviewed  Current Outpatient Prescriptions  Medication Sig Dispense Refill  . clindamycin (CLINDAGEL) 1 % gel      No current facility-administered medications for this visit.     Objective: BP (!) 156/98 (BP Location: Left Arm, Patient Position: Sitting, Cuff Size: Normal)   Pulse (!) 101   Temp 98.7 F (37.1 C) (Oral)   Ht  (1.6 m)   Wt 131 lb 12.8 oz (59.8 kg)   LMP 05/18/2016   SpO2 99%   BMI 23.35 kg/m  Gen: NAD, resting comfortably HEENT: Turbinates erythematous, TM normal, pharynx mildly erythematous with no tonsilar exudate or edema, no sinus tenderness CV: RRR no murmurs rubs or gallops Lungs: CTAB no crackles, wheeze, rhonchi Abdomen: soft/nontender/nondistended/normal bowel sounds. No rebound or guarding.  Ext: no edema Skin: warm, dry, no rash Neuro: grossly normal, moves all extremities  Results for orders placed or performed in visit on 06/03/16 (from the past 24 hour(s))  POCT rapid strep A     Status: None   Collection Time: 06/03/16 10:14 AM  Result Value Ref Range   Rapid Strep A Screen  Negative Negative  POC Influenza A&B(BINAX/QUICKVUE)     Status: None   Collection Time: 06/03/16 10:24 AM  Result Value Ref Range   Influenza A, POC Negative Negative   Influenza B, POC Negative Negative   Assessment/Plan:  Upper Respiratory infection History and exam today are suggestive of viral infection most likely due to upper respiratory infection. Her rapid strep and flu tests were negative. Her centor criteria score was 1 indicating only about 5-10% chance of strep in the first place so we opted not to do strep culture- if she had fever she would return to see [place]. Symptomatic treatment with: tylenol  every 6 hours, sore throat lozenges, chloraseptic sore throat spray, salt water gargles. Rest and staying well hydrated are also great choices.   We discussed that we did not find any infection that had higher probability of being bacterial such as pneumonia or strep throat. We discussed signs that bacterial infection may have developed particularly fever or shortness of breath. Likely course of 1-2 weeks. Patient is contagious and advised good handwashing and consideration of mask If going to be in public places.   She feels pretty fatigued and was told ideal to not be at work with mask on- she requests to be out of work through Monday and this was written for her.   Finally, we reviewed reasons to return to care including if symptoms worsen or persist or new concerns arise- once again particularly shortness of breath or fever.  Elevated blood pressure reading S: controlled poorly on no rx. Home #s in 130s over  usually 90s so trending up diastolic.  BP Readings from Last 3 Encounters:  06/03/16 (!) 138/94  04/16/16 (!) 142/80  04/07/16 (!) 124/94  A/P: advised 6 week repeat evaluation for BP when not feeling ill- if remains up likel yneed to restart amlodipine   Tana Conch, MD

## 2016-06-03 NOTE — Assessment & Plan Note (Signed)
S: controlled poorly on no rx. Home #s in 130s over usually 90s so trending up diastolic.  BP Readings from Last 3 Encounters:  06/03/16 (!) 138/94  04/16/16 (!) 142/80  04/07/16 (!) 124/94  A/P: advised 6 week repeat evaluation for BP when not feeling ill- if remains up likel yneed to restart amlodipine

## 2016-06-03 NOTE — Progress Notes (Signed)
Pre visit review using our clinic review tool, if applicable. No additional management support is needed unless otherwise documented below in the visit note. 

## 2016-06-03 NOTE — Patient Instructions (Signed)
Upper Respiratory infection History and exam today are suggestive of viral infection most likely due to upper respiratory infection. Her rapid strep and flu tests were negative. Her centor criteria score was 1 indicating only about 5-10% chance of strep in the first place so we opted not to do strep culture- if she had fever she would return to see [place]. Symptomatic treatment with: tylenol  every 6 hours, sore throat lozenges, chloraseptic sore throat spray, salt water gargles. Rest and staying well hydrated are also great choices.   We discussed that we did not find any infection that had higher probability of being bacterial such as pneumonia or strep throat. We discussed signs that bacterial infection may have developed particularly fever or shortness of breath. Likely course of 1-2 weeks. Patient is contagious and advised good handwashing and consideration of mask If going to be in public places.   She feels pretty fatigued and was told ideal to not be at work with mask on- she requests to be out of work through Monday and this was written for her.   Finally, we reviewed reasons to return to care including if symptoms worsen or persist or new concerns arise- once again particularly shortness of breath or fever.

## 2016-06-04 ENCOUNTER — Telehealth: Payer: Self-pay | Admitting: Family Medicine

## 2016-06-04 MED ORDER — BENZONATATE 100 MG PO CAPS: 100.0000 mg | ORAL_CAPSULE | Freq: Two times a day (BID) | ORAL | 0 refills | Status: DC | PRN

## 2016-06-04 NOTE — Telephone Encounter (Signed)
Sent in please inform patient 

## 2016-06-04 NOTE — Telephone Encounter (Signed)
Pt calling stating that she has tried the OT cough medication and it is not working and would like to see if Dr. Durene Cal would call her in tessalon perles pills due to the liquid make her dizzy and drowsy.  Pharm:  [facility] on [address]

## 2016-06-05 NOTE — Telephone Encounter (Signed)
Called and spoke with patient who stated she went and picked up her medication last night and started taking it.

## 2016-06-18 ENCOUNTER — Encounter: Payer: Self-pay | Admitting: Family Medicine

## 2016-06-18 ENCOUNTER — Ambulatory Visit (INDEPENDENT_AMBULATORY_CARE_PROVIDER_SITE_OTHER): Payer: BLUE CROSS/BLUE SHIELD | Admitting: Family Medicine

## 2016-06-18 VITALS — BP 130/80 | HR 90 | Temp 98.4°F | Ht 63.0 in | Wt 133.2 lb

## 2016-06-18 DIAGNOSIS — F458 Other somatoform disorders: Secondary | ICD-10-CM | POA: Diagnosis not present

## 2016-06-18 DIAGNOSIS — R0989 Other specified symptoms and signs involving the circulatory and respiratory systems: Secondary | ICD-10-CM

## 2016-06-18 DIAGNOSIS — R198 Other specified symptoms and signs involving the digestive system and abdomen: Secondary | ICD-10-CM

## 2016-06-18 MED ORDER — OMEPRAZOLE 40 MG PO CPDR: 40.0000 mg | DELAYED_RELEASE_CAPSULE | Freq: Every day | ORAL | 1 refills | Status: AC

## 2016-06-18 NOTE — Patient Instructions (Signed)
Take prilosec once a day before breakfast. If not improving within 1-2 weeks, lets get you set up with a referral to ENT (ear, nose and throat) doctors  We need to know immediately if difficulty swallowing, unintentional weight loss, foods get stuck with swallowing

## 2016-06-18 NOTE — Progress Notes (Signed)
Subjective:  Stacy Costa is a 36 y.o. year old very pleasant female patient who presents for/with See problem oriented charting ROS- no unintentional weight loss. No fever or chills or night sweats.  No masses of neck.   Past Medical History-  Patient Active Problem List   Diagnosis Date Noted  . Gallstones 04/02/2016    Priority: Medium  . Elevated blood pressure reading     Priority: Medium  . Gastroesophageal reflux disease 04/16/2016  . Costochondral pain 03/27/2016  . 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 Other chest pain 01/13/2016    Medications- reviewed and updated Current Outpatient Prescriptions  Medication Sig Dispense Refill  . clindamycin (CLINDAGEL) 1 % gel     . omeprazole (PRILOSEC) 40 MG capsule Take 1 capsule (40 mg total) by mouth daily. 30 capsule 1   No current facility-administered medications for this visit.     Objective: BP 130/80 (BP Location: Left Arm, Patient Position: Sitting, Cuff Size: Normal)   Pulse 90   Temp 98.4 F (36.9 C) (Oral)   Ht 5\' 3"  (1.6 m)   Wt 133 lb 3.2 oz (60.4 kg)   SpO2 98%   BMI 23.60 kg/m  Gen: NAD, resting comfortably TM normal, nares normal, pharynx with mild erythema and cobblestoning No lymphadenopathy. No obvious thyromegaly or nodules. No abnormalities in area of her full feeling.  CV: RRR no murmurs rubs or gallops Lungs: CTAB no crackles, wheeze, rhonchi Ext: no edema Skin: warm, dry  Assessment/Plan:  Globus sensation S: Patient treated for URI here about 15 days ago. She states after that point she started Feeling like something is in her throat all the time. Food and water do not feel like they get stuck when swallowing. No discomfort just a vague feeling of something being in her throat. Feels it with swallowing saliva or even not swallowing at all. Denies pain. Denies lymphadenopathy. Feels like has gotten slightly worse. Slightly hoarse voice but thought that could be from the coughing that she has which  lingers mildly.   A/P: GLobus sensation. No objective findings today to account for her current symptoms. We discussed a trial of PPI for next 2-3 weeks- if improving can continue. If fails to improve, will refer to ENT. Offered referral to ENT initially but she would prefer conservative measures. I do not think her symptoms are related to recent URI other than hoarseness could be potentially. She does not have other clear indications for reflux treatments but think trial relatively low risk since she does not have any alarm features.   She is having regular periods and uses condoms for protection so no concerns for   Meds ordered this encounter  Medications  . omeprazole (PRILOSEC) 40 MG capsule    Sig: Take 1 capsule (40 mg total) by mouth daily.    Dispense:  30 capsule    Refill:  1   Return precautions advised.  Tana ConchStephen Erion Hermans, MD

## 2016-08-07 ENCOUNTER — Ambulatory Visit (INDEPENDENT_AMBULATORY_CARE_PROVIDER_SITE_OTHER): Payer: BLUE CROSS/BLUE SHIELD | Admitting: Family Medicine

## 2016-08-07 ENCOUNTER — Encounter: Payer: Self-pay | Admitting: Family Medicine

## 2016-08-07 VITALS — BP 138/88 | HR 108 | Temp 98.5°F | Ht 63.0 in | Wt 132.6 lb

## 2016-08-07 DIAGNOSIS — M545 Low back pain, unspecified: Secondary | ICD-10-CM

## 2016-08-07 DIAGNOSIS — R309 Painful micturition, unspecified: Secondary | ICD-10-CM

## 2016-08-07 LAB — POC URINALSYSI DIPSTICK (AUTOMATED)
Bilirubin, UA: NEGATIVE
Glucose, UA: NEGATIVE
Ketones, UA: NEGATIVE
Leukocytes, UA: NEGATIVE
NITRITE UA: NEGATIVE
PROTEIN UA: NEGATIVE
SPEC GRAV UA: 1.01 (ref 1.010–1.025)
UROBILINOGEN UA: NEGATIVE U/dL — AB
pH, UA: 6 (ref 5.0–8.0)

## 2016-08-07 MED ORDER — CEPHALEXIN 500 MG PO CAPS: 500.0000 mg | ORAL_CAPSULE | Freq: Three times a day (TID) | ORAL | 0 refills | Status: AC

## 2016-08-07 NOTE — Progress Notes (Signed)
Subjective:  Laveyah Oriol is a 36 y.o. year old very pleasant female patient who presents for/with See problem oriented charting ROS- no fever, chills, nausea, vomiting. Some suprapubic and cva tenderness.    Past Medical History-  Patient Active Problem List   Diagnosis Date Noted  . Gallstones 04/02/2016    Priority: Medium  . Elevated blood pressure reading     Priority: Medium  . Gastroesophageal reflux disease 04/16/2016  . Costochondral pain 03/27/2016  . [place] Bullock 03/27/2016  . Other chest pain 01/13/2016    Medications- reviewed and updated Current Outpatient Prescriptions  Medication Sig Dispense Refill  . clindamycin (CLINDAGEL) 1 % gel     . cephALEXin (KEFLEX) 500 MG capsule Take 1 capsule (500 mg total) by mouth 3 (three) times daily. 21 capsule 0  . omeprazole (PRILOSEC) 40 MG capsule Take 1 capsule (40 mg total) by mouth daily. (Patient not taking: Reported on 08/07/2016) 30 capsule 1   No current facility-administered medications for this visit.     Objective: BP 138/88   Pulse (!) 108   Temp 98.5 F (36.9 C) (Oral)   Ht 5' 3"  (1.6 m)   Wt 132 lb 9.6 oz (60.1 kg)   LMP 07/13/2016   SpO2 92%   BMI 23.49 kg/m  Gen: NAD, resting comfortably CV: RRR no murmurs rubs or gallops Lungs: CTAB no crackles, wheeze, rhonchi Abdomen: soft/nontender outside of very mild suprapubic pain/nondistended/normal bowel sounds. No rebound or guarding.  Mild low back pain with palpation but none midline Ext: no edema Skin: warm, dry Results for orders placed or performed in visit on 08/07/16 (from the past 24 hour(s))  POCT Urinalysis Dipstick (Automated)     Status: Abnormal   Collection Time: 08/07/16  3:21 PM  Result Value Ref Range   Color, UA     Clarity, UA     Glucose, UA neg    Bilirubin, UA neg    Ketones, UA neg    Spec Grav, UA 1.010 1.010 - 1.025   Blood, UA 1+    pH, UA 6.0 5.0 - 8.0   Protein, UA neg    Urobilinogen, UA negative (A) 0.2 or 1.0  E.U./dL   Nitrite, UA neg    Leukocytes, UA Negative Negative   Assessment/Plan:  Painful urination - Plan: POCT Urinalysis Dipstick (Automated), Urine Culture, Urine Microscopic Only S: bilateral low back pain worse on the left. Mild burning with urination. Did home UTI kit and was positive for something. Has not noted blood in urine. No history of kidney stones. Symptoms for 2 weeks but worsened on Monday. Some frequency but drinks fair amount of water. Nocturia at least once a night- new for her. No urgency. Some suprapubic discomfort A/P: Not clearly UTI based on UA- with 2 weeks of symptoms thought reasonable to wait on urine culture as long as symptoms do not worsen- if symptoms worsen she will take the keflex that was prescribed today (printed rx). This could be musculoskeletal related. She also was worried that these issues could be related to weight loss. She is not actually gaining weight but is  trying to gain but anywhere form 1400 to 1800 calories only- we discussed would workup weight loss only if actually losing weight.   Home #s 120s over high 180s so will monitor only BP Readings from Last 3 Encounters:  08/07/16 138/88  06/18/16 130/80  06/03/16 (!) 138/94   Patient Instructions  Possible urinary tract infection  Suspect you would have  more significant pain if kidney stone but there was possible blood so we will do a follow up test. Will do urology consult if needed.  Should have results by Monday  Start keflex if worsening symptoms (back pain, frequency of urination, burning with peeing, suprapubic discomfort)  Please see [place] back if losing more than 5 lbs in the next 3 months  Orders Placed This Encounter  Procedures  . Urine Culture    [facility]  . Urine Microscopic Only  . POCT Urinalysis Dipstick (Automated)    Meds ordered this encounter  Medications  . cephALEXin (KEFLEX) 500 MG capsule    Sig: Take 1 capsule (500 mg total) by mouth 3 (three) times daily.     Dispense:  21 capsule    Refill:  0    Return precautions advised.  Garret Reddish, MD

## 2016-08-07 NOTE — Patient Instructions (Addendum)
Possible urinary tract infection  Suspect you would have more significant pain if kidney stone but there was possible blood so we will do a follow up test. Will do urology consult if needed.  Should have results by Monday  Start keflex if worsening symptoms (back pain, frequency of urination, burning with peeing, suprapubic discomfort)  Please see us back if losing more than 5 lbs in the next 3 months

## 2016-08-08 LAB — URINALYSIS, MICROSCOPIC ONLY
Bacteria, UA: NONE SEEN [HPF]
Casts: NONE SEEN [LPF]
Crystals: NONE SEEN [HPF]
RBC / HPF: NONE SEEN RBC/HPF (ref <=2)
WBC, UA: NONE SEEN WBC/HPF (ref <=5)
Yeast: NONE SEEN [HPF]

## 2016-08-08 LAB — URINE CULTURE: Organism ID, Bacteria: NO GROWTH

## 2017-04-12 IMAGING — CT CT CHEST W/ CM
2 of 3 series · 16 of 30 positions shown, 19 images · IV contrast (iopamidol)
Comparison: None.

CLINICAL DATA: Sternal chest pain and left anterior chest pain
since [REDACTED] of last year.

EXAM:
CT CHEST WITH CONTRAST
TECHNIQUE: Multidetector CT imaging of the chest was performed during
intravenous contrast administration.
CONTRAST:  75mL UIK5W2-0MM IOPAMIDOL (UIK5W2-0MM) INJECTION 61%

[Series 3: chest with · axial · 0.70mm/px · z∈[-206,+16]mm · 10 of 109 slices shown, 13 images]
[im 10/109  mediastinal]
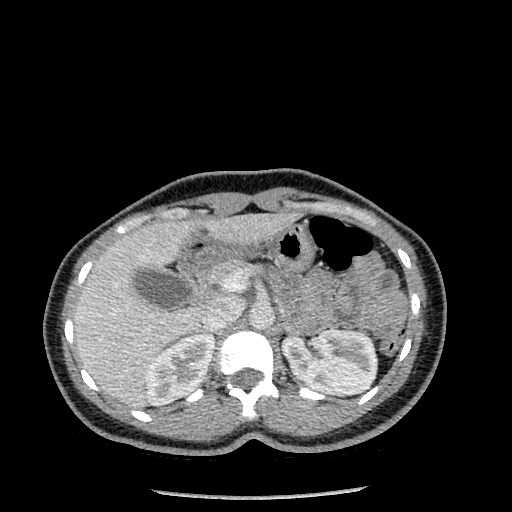
[im 10/109  lung]
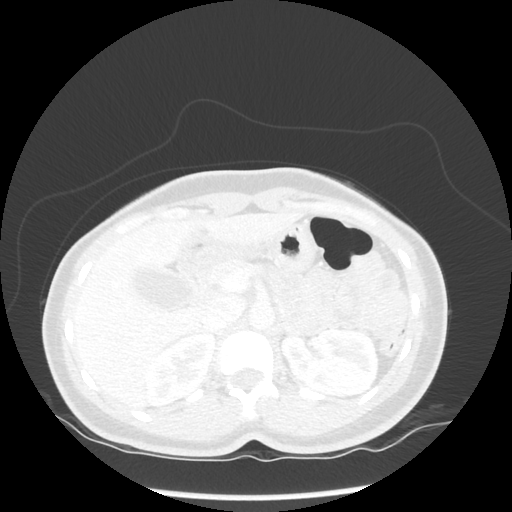
[im 20/109  lung]
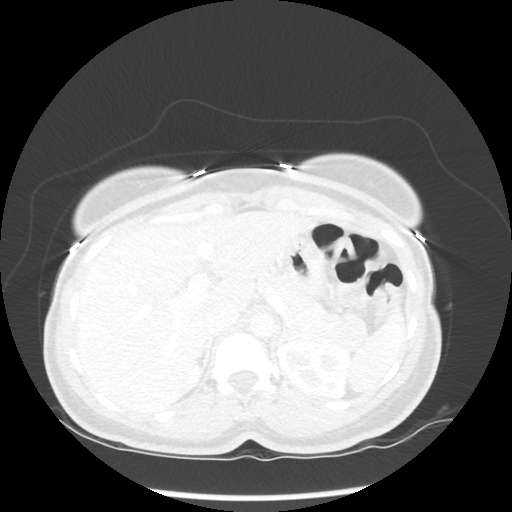
[im 30/109  lung]
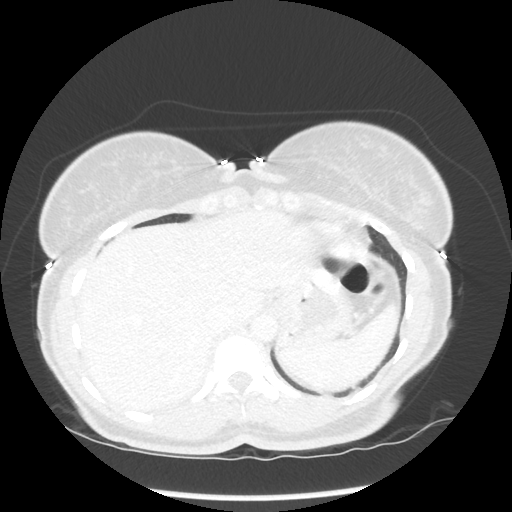
[im 40/109  lung]
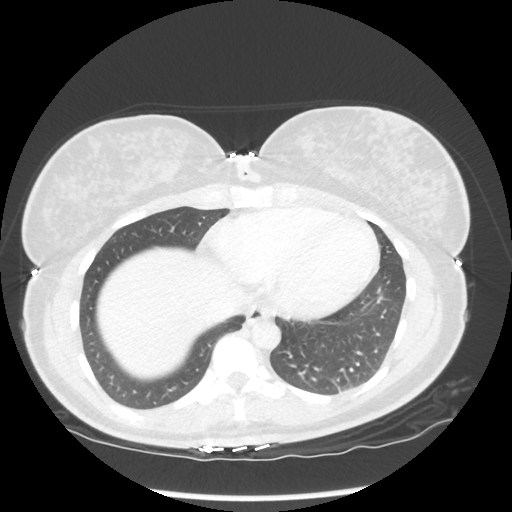
[im 50/109  mediastinal]
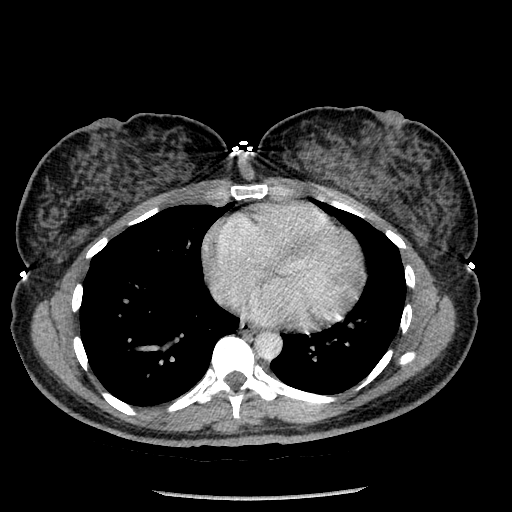
[im 50/109  lung]
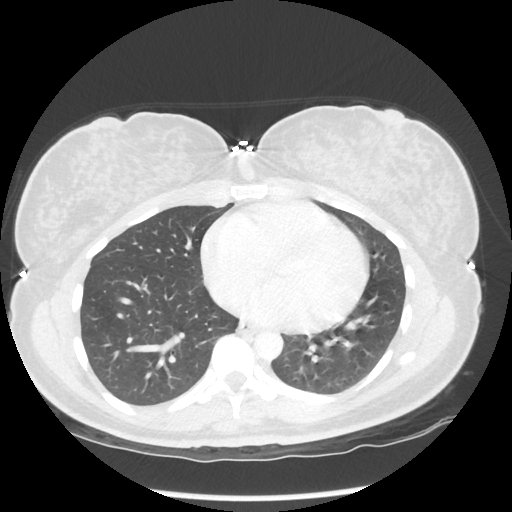
[im 59/109  lung]
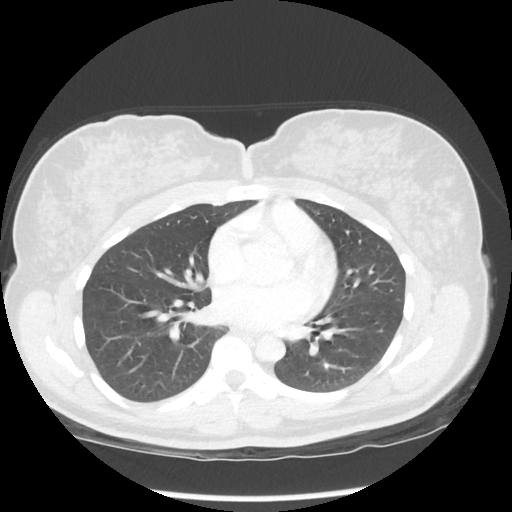
[im 69/109  lung]
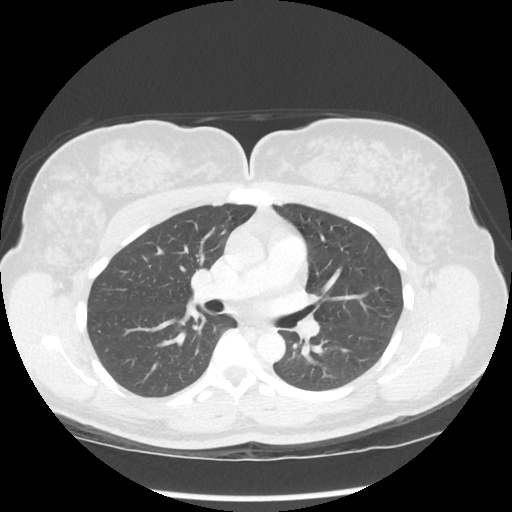
[im 79/109  lung]
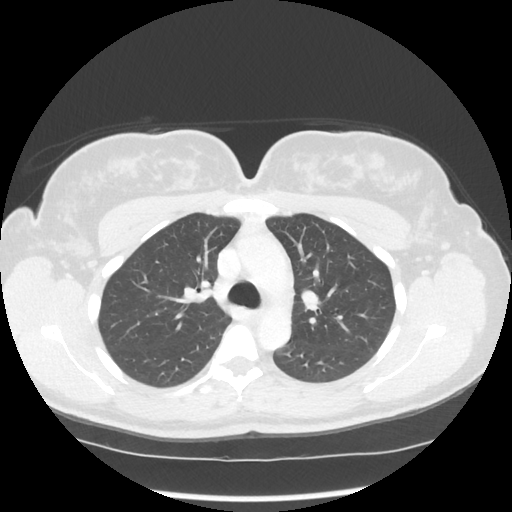
[im 89/109  mediastinal]
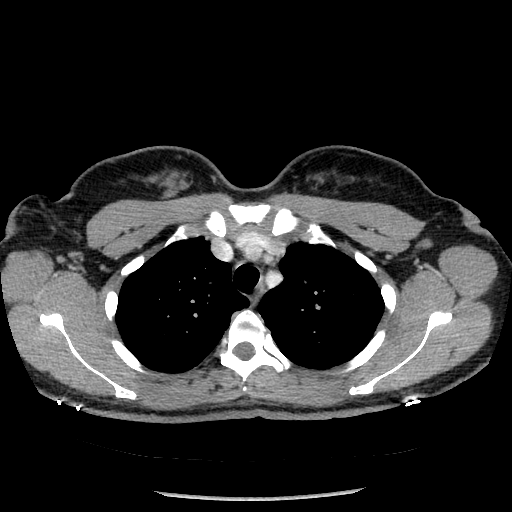
[im 89/109  lung]
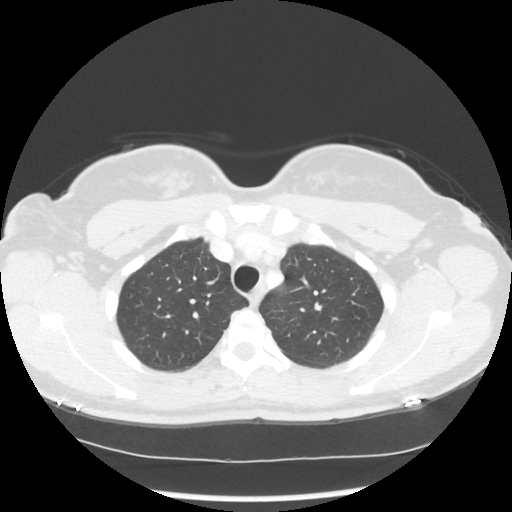
[im 99/109  lung]
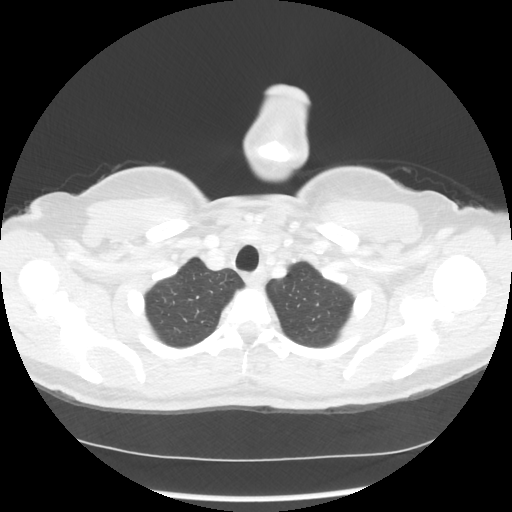

[Series 602: sagittal body · sagittal · 0.70mm/px · 6 of 145 slices shown]
[im 10/145  mediastinal]
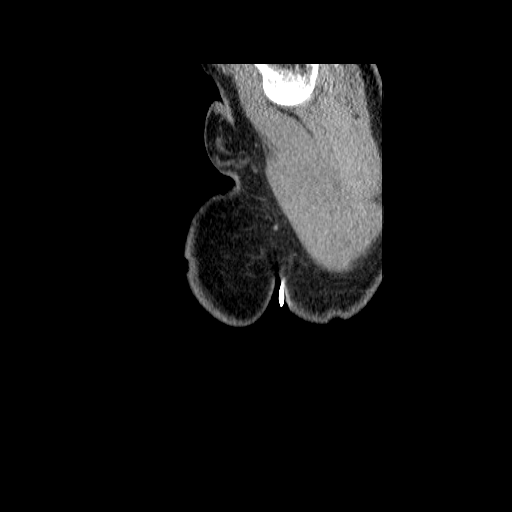
[im 29/145  mediastinal]
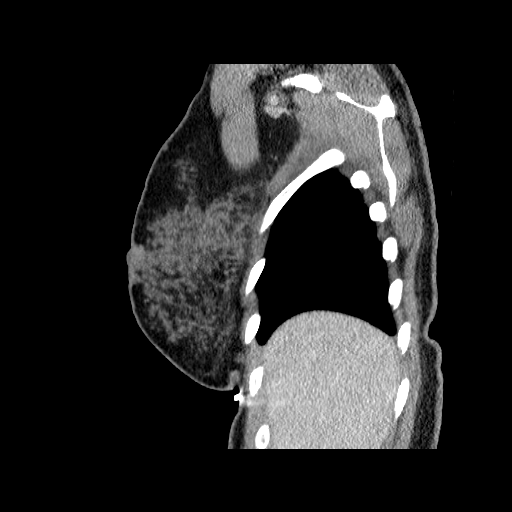
[im 49/145  mediastinal]
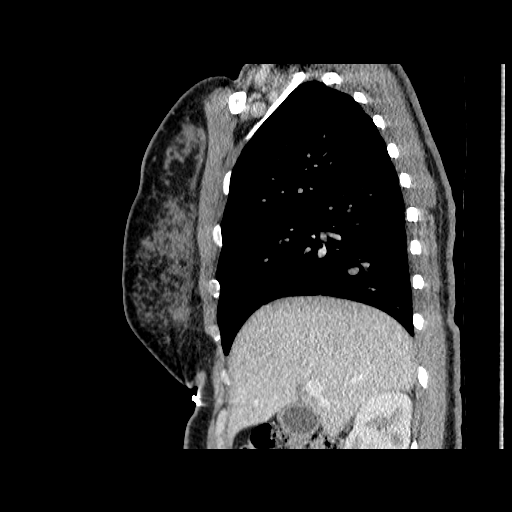
[im 68/145  mediastinal]
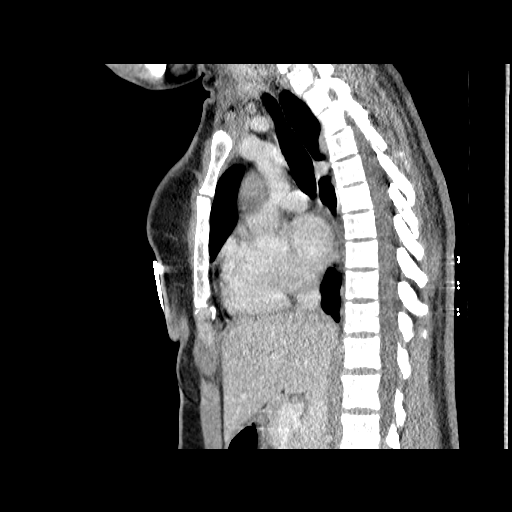
[im 77/145  mediastinal]
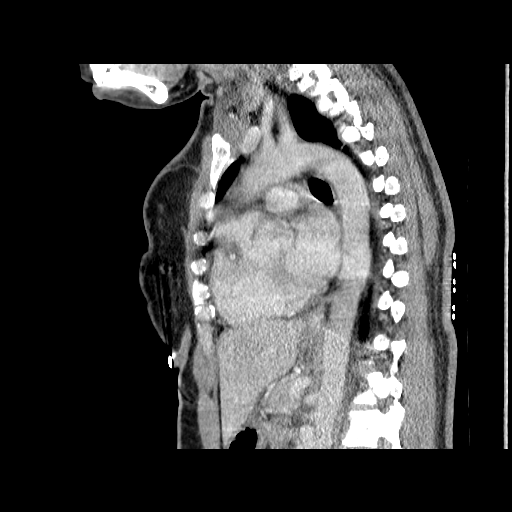
[im 97/145  mediastinal]
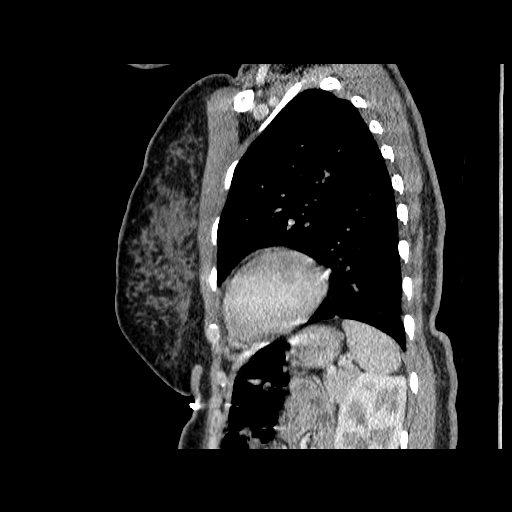

[16 of 30 positions shown; findings below may reference images not displayed]

FINDINGS: Cardiovascular: Heart size is normal. No pericardial fluid. No
visible coronary calcification or aortic calcification. Pulmonary
arteries appear normal as opacified. Vascular opacification is low.

Mediastinum/Nodes: No mass or lymphadenopathy.

Lungs/Pleura: Normal

Upper Abdomen: Several small stones floating in the gallbladder. The
gallbladder could actually be full of stones. Is there any concern
for active gallbladder disease?

Musculoskeletal: Negative. Specifically, no visible sternal or rib
abnormality.
IMPRESSION: Normal chest exam.

Gallbladder disease. There are at least several small stones in the
gallbladder and the gallbladder could actually be full of
noncalcified stones. Is there any clinical concern regarding active
gallbladder pathology?

## 2021-11-03 ENCOUNTER — Encounter: Payer: Self-pay | Admitting: *Deleted

## 2022-11-25 ENCOUNTER — Encounter: Payer: Self-pay | Admitting: Gastroenterology

## 2023-03-02 ENCOUNTER — Ambulatory Visit: Payer: Self-pay | Admitting: Gastroenterology
